# Patient Record
Sex: Male | Born: 1955 | Race: White | Hispanic: No | Marital: Married | Smoking: Never smoker
Health system: Southern US, Community
[De-identification: ages and names within clinical notes are randomized; demographics above are authoritative.]

## PROBLEM LIST (undated history)

## (undated) HISTORY — PX: KNEE SURGERY: SHX244

---

## 2008-09-17 ENCOUNTER — Ambulatory Visit: Payer: Self-pay | Admitting: Gastroenterology

## 2011-08-10 ENCOUNTER — Emergency Department: Payer: Self-pay | Admitting: Emergency Medicine

## 2011-08-24 ENCOUNTER — Emergency Department: Payer: Self-pay | Admitting: *Deleted

## 2012-11-04 IMAGING — CR LEFT INDEX FINGER 2+V
1 series · 3 of 3 positions shown · non-contrast
Comparison: none

REASON FOR EXAM: laceration
COMMENTS:   LMP: (Male)

[Series 1: x finger pa left · 0.14mm/px · 3 of 3 slices shown]
[im 1/3]
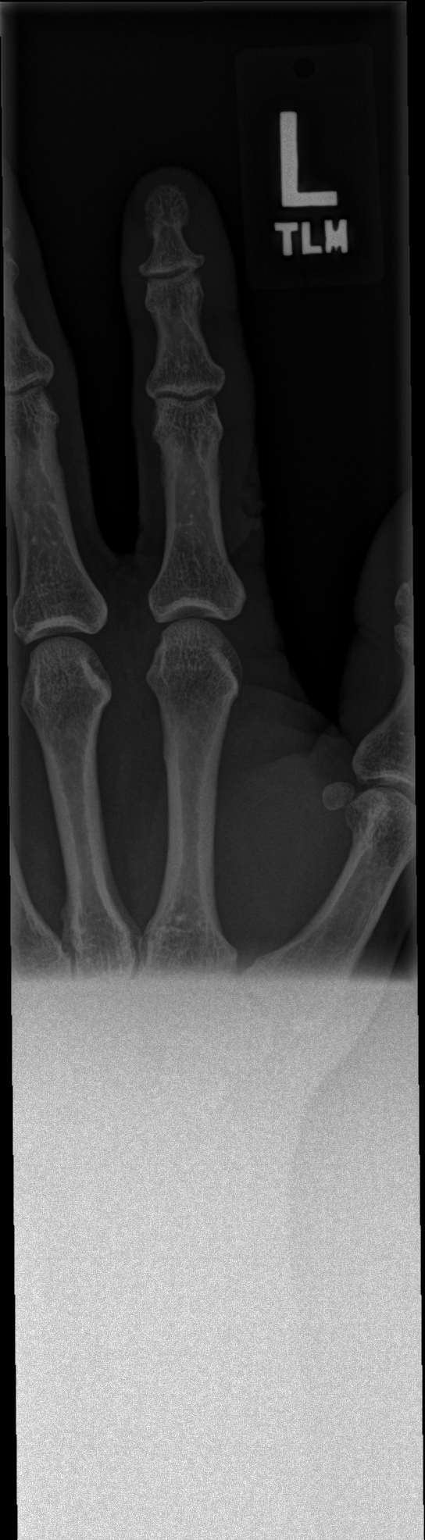
[im 2/3]
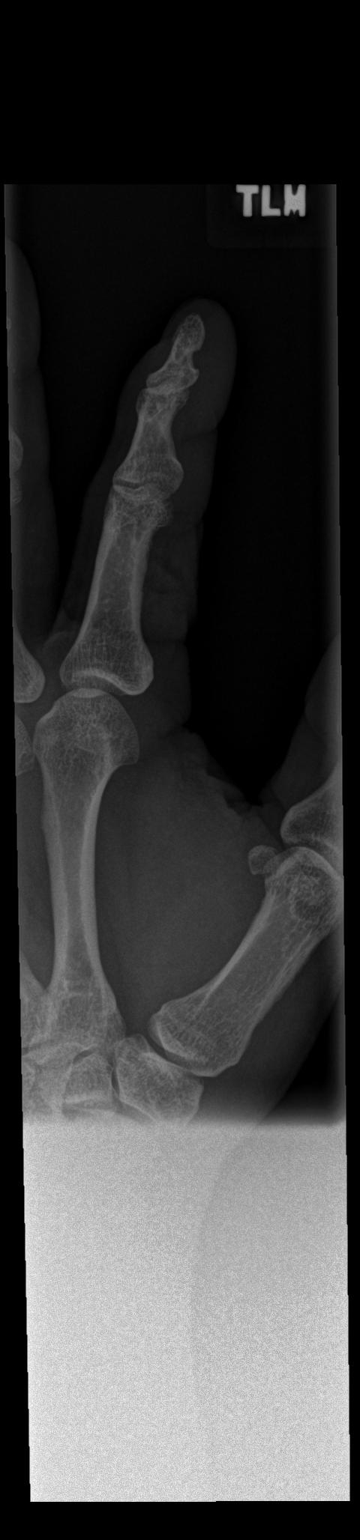
[im 3/3]
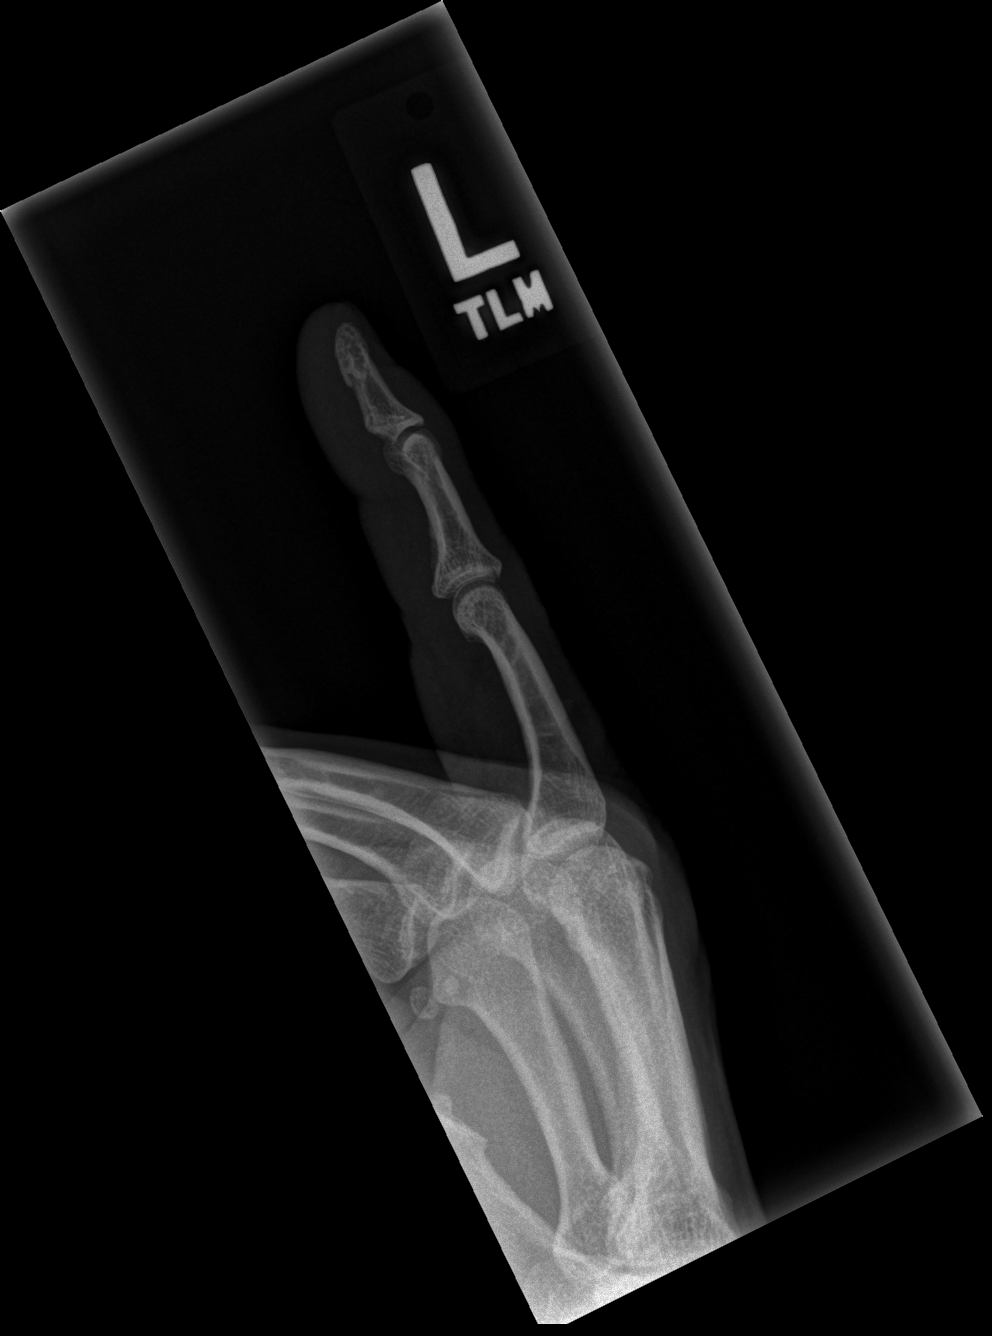

[3 of 3 positions shown; findings below may reference images not displayed]

PROCEDURE:     DXR - DXR FINGER INDEX 2ND DIGIT LT HA  - August 11, 2011 [DATE]

RESULT:     Images of the left second finger demonstrate soft tissue defect
about the lateral aspect overlying the mid to proximal aspect of the
proximal phalanx. There is no definite fracture demonstrated on the PA and
oblique view. There is some minimal lucency about the proximal dorsal aspect
of the middle phalanx on the lateral view which could be artifact. Correlate
clinically.
IMPRESSION: Please see above.

[REDACTED]

## 2015-11-29 ENCOUNTER — Ambulatory Visit (INDEPENDENT_AMBULATORY_CARE_PROVIDER_SITE_OTHER): Payer: BLUE CROSS/BLUE SHIELD | Admitting: Urology

## 2015-11-29 ENCOUNTER — Encounter: Payer: Self-pay | Admitting: Urology

## 2015-11-29 DIAGNOSIS — Z3009 Encounter for other general counseling and advice on contraception: Secondary | ICD-10-CM | POA: Diagnosis not present

## 2015-11-29 DIAGNOSIS — Z125 Encounter for screening for malignant neoplasm of prostate: Secondary | ICD-10-CM | POA: Insufficient documentation

## 2015-11-29 MED ORDER — DIAZEPAM 10 MG PO TABS: 10.0000 mg | ORAL_TABLET | Freq: Once | ORAL | 0 refills | Status: AC

## 2015-11-29 NOTE — Progress Notes (Signed)
   11/29/2015 9:08 AM   Thomas Luna 03-12-1955 [id number]  Referring provider: No referring provider defined for this encounter.  No chief complaint on file.   HPI:  1 - Desire for Male Sterilization - Pt with 2 children ages 6914 and [age]. He and his wife have been considering possible vasectomy for at least 3 years. Vas palpable on exam bilateraly. No h/o easy bleeding.   2 - Prostate Screening - Pt's uncle with prostate cancer 11/2015 DRE 45gm smooth /  PSA (today / pending)  Today " Thomas Luna " is seen as new patient for above.    PMH: No past medical history on file.  Surgical History: No past surgical history on file.  Home Medications:    Medication List    as of 11/29/2015  9:08 AM   You have not been prescribed any medications.     Allergies: Allergies not on file  Family History: No family history on file.  Social History:  has no tobacco, alcohol, and drug history on file.    Review of Systems  Gastrointestinal (upper)  : Negative for upper GI symptoms  Gastrointestinal (lower) : Negative for lower GI symptoms  Constitutional : Negative for symptoms  Skin: Negative for skin symptoms  Eyes: Negative for eye symptoms  Ear/Nose/Throat : Negative for Ear/Nose/Throat symptoms  Hematologic/Lymphatic: Negative for Hematologic/Lymphatic symptoms  Cardiovascular : Negative for cardiovascular symptoms  Respiratory : Negative for respiratory symptoms  Endocrine: Negative for endocrine symptoms  Musculoskeletal: Negative for musculoskeletal symptoms  Neurological: Negative for neurological symptoms  Psychologic: Negative for psychiatric symptoms       Physical Exam: There were no vitals taken for this visit.  Constitutional:  Alert and oriented, No acute distress. HEENT: [place] AT, moist mucus membranes.  Trachea midline, no masses. Cardiovascular: No clubbing, cyanosis, or edema. Respiratory: Normal respiratory effort, no increased  work of breathing. GI: Abdomen is soft, nontender, nondistended, no abdominal masses GU: No CVA tenderness. Phallus straight. NO testes masses. Vas palpable bilaterally.  Skin: No rashes, bruises or suspicious lesions. Lymph: No cervical or inguinal adenopathy. Neurologic: Grossly intact, no focal deficits, moving all 4 extremities. Psychiatric: Normal mood and affect.  Laboratory Data: No results found for: WBC, HGB, HCT, MCV, PLT  No results found for: CREATININE  No results found for: PSA  No results found for: TESTOSTERONE  No results found for: HGBA1C  Urinalysis No results found for: COLORURINE, APPEARANCEUR, LABSPEC, PHURINE, GLUCOSEU, HGBUR, BILIRUBINUR, KETONESUR, PROTEINUR, UROBILINOGEN, NITRITE, LEUKOCYTESUR    Assessment & Plan:   1. Vasectomy evaluation - risks, benefits, alternatives, peri-procedure course, permanance of procedure discussed. Specifically adressed NOT instantly effective and must continue other forms of birth control until cleared by MD  He would like to proceed with elective office vasectomy. Diazepam 10mg  to take 1 hr prior RX'd today.    2 - Prostate Screening - PSA today, rec continue annual screening until age 170 or so as average risk.   No Follow-up on file.  Sebastian AcheMANNY, Crisanto Nied, MD  [facility] [address], [address] [place], [place] [address] [phone]

## 2015-11-29 NOTE — Addendum Note (Signed)
Addended by: Lonna CobbUSSELL, KELITA L on: 11/29/2015 03:54 PM   Modules accepted: Orders

## 2015-11-30 LAB — PSA: PROSTATE SPECIFIC AG, SERUM: 0.6 ng/mL (ref 0.0–4.0)

## 2016-02-03 ENCOUNTER — Ambulatory Visit (INDEPENDENT_AMBULATORY_CARE_PROVIDER_SITE_OTHER): Payer: BLUE CROSS/BLUE SHIELD | Admitting: Urology

## 2016-02-03 ENCOUNTER — Encounter: Payer: Self-pay | Admitting: Urology

## 2016-02-03 VITALS — BP 125/76 | HR 92 | Ht 67.0 in | Wt 196.8 lb

## 2016-02-03 DIAGNOSIS — Z3009 Encounter for other general counseling and advice on contraception: Secondary | ICD-10-CM

## 2016-02-03 MED ORDER — OXYCODONE-ACETAMINOPHEN 5-325 MG PO TABS: 1.0000 | ORAL_TABLET | ORAL | 0 refills | Status: AC | PRN

## 2016-02-03 NOTE — Progress Notes (Signed)
02/03/16  CC:  Chief Complaint  Patient presents with  . Sterilization    HPI:  1 - Desire for Male Sterilization - Pt with 2 children ages 6914 and 9217. He and his wife have been considering possible vasectomy for at least 3 years. Vas palpable on exam bilateraly. No h/o easy bleeding.   Blood pressure 125/76, pulse 92, height 5\' 7"  (1.702 m), weight 196 lb 12.8 oz (89.3 kg). NED. A&Ox3.   No respiratory distress   Abd soft, NT, ND Normal external genitalia with patent urethral meatus   Bilateral Vasectomy Procedure  Pre-Procedure: - Patient's scrotum was prepped and draped for vasectomy. - The vas was palpated through the scrotal skin on the left. - 1% Xylocaine was injected into the skin and surrounding tissue for placement  - In a similar manner, the vas on the right was identified, anesthetized, and stabilized.  Procedure: - A bladeless technique was used to open the overlying skin (sharp dissection) - The left vas was isolated and brought up through the incision exposing that structure. - Bleeding points were cauterized as they occurred. - The vas was free from the surrounding structures and brought to the view. - A segment was positioned for placement with a hemostat. - A second hemostat was placed and a small segment between the two hemostats and was removed for inspection. - Each end of the transected vas lumen was fulgurated/ obliterated using needlepoint electrocautery -A fascial interposition was performed on testicular end of the vas using #3-0 chromic suture -The same procedure was performed on the right. - A single suture of #3-0 chromic catgut was used to close each lateral scrotal skin incision - A dressing was applied.  Post-Procedure: - Patient was instructed in care of the operative area - A specimen is to be delivered in 12 weeks   -Another form of contraception is to be used until post vasectomy semen analysis  Hildred LaserBrian Ran Ramiah Helfrich, MD

## 2016-09-07 ENCOUNTER — Other Ambulatory Visit: Payer: BLUE CROSS/BLUE SHIELD

## 2016-09-07 DIAGNOSIS — Z9852 Vasectomy status: Secondary | ICD-10-CM

## 2016-09-08 LAB — POST-VAS SPERM EVALUATION,QUAL: SEMEN VOLUME: 2.5 mL

## 2016-09-10 ENCOUNTER — Telehealth: Payer: Self-pay | Admitting: Family Medicine

## 2016-09-10 NOTE — Telephone Encounter (Signed)
Left message on machine for patient to return call

## 2016-09-10 NOTE — Telephone Encounter (Signed)
-----   Message from [name], MD sent at 09/10/2016  9:03 AM EDT ----- Please let patient know there was some sperm seen in specimen, but this test does not tell Thomas Luna how many or if they are mobile which is important. Can we have him undergo a formal semen analysis in 4 weeks? This will give Thomas Luna more information. Thanks   ----- Message ----- From: Ellin GoodieLowe, Casandra S, CMA Sent: 09/10/2016   8:13 AM To: Hildred LaserBrian Jousha Budzyn, MD    ----- Message ----- From: Interface, [facility] Lab Results In Sent: 09/08/2016   4:36 PM To: Jennette KettleB[facility]

## 2016-09-11 NOTE — Telephone Encounter (Signed)
Spoke to patient yesterday evening. Gave pt results and instructions. Patient aware to come p/u paperwork and cup for formal semen analysis.

## 2022-10-02 ENCOUNTER — Ambulatory Visit: Payer: BC Managed Care – PPO

## 2022-10-02 DIAGNOSIS — Z09 Encounter for follow-up examination after completed treatment for conditions other than malignant neoplasm: Secondary | ICD-10-CM | POA: Diagnosis not present

## 2022-10-02 DIAGNOSIS — K621 Rectal polyp: Secondary | ICD-10-CM

## 2022-10-02 DIAGNOSIS — K640 First degree hemorrhoids: Secondary | ICD-10-CM

## 2022-10-02 DIAGNOSIS — Z86010 Personal history of colonic polyps: Secondary | ICD-10-CM

## 2023-10-10 ENCOUNTER — Ambulatory Visit
Admission: RE | Admit: 2023-10-10 | Discharge: 2023-10-10 | Disposition: A | Discharge status: Home / Self Care | Source: Ambulatory Visit | Attending: Physician Assistant | Admitting: Physician Assistant

## 2023-10-10 ENCOUNTER — Other Ambulatory Visit: Payer: Self-pay | Admitting: Physician Assistant

## 2023-10-10 DIAGNOSIS — R1030 Lower abdominal pain, unspecified: Secondary | ICD-10-CM

## 2023-10-10 DIAGNOSIS — R310 Gross hematuria: Secondary | ICD-10-CM | POA: Insufficient documentation

## 2023-10-10 MED ORDER — IOHEXOL 300 MG/ML  SOLN
100.0000 mL | Freq: Once | INTRAMUSCULAR | Status: AC | PRN
  Administered 2023-10-10: 100 mL via INTRAVENOUS
